# Patient Record
Sex: Female | Born: 1975 | Race: White | Hispanic: Yes | Marital: Married | Smoking: Never smoker
Health system: Southern US, Community
[De-identification: ages and names within clinical notes are randomized; demographics above are authoritative.]

## PROBLEM LIST (undated history)

## (undated) HISTORY — PX: TUBAL LIGATION: SHX77

---

## 2005-05-04 ENCOUNTER — Emergency Department (HOSPITAL_COMMUNITY): Admission: EM | Admit: 2005-05-04 | Discharge: 2005-05-04 | Payer: Self-pay | Admitting: Emergency Medicine
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
## 2015-04-13 ENCOUNTER — Ambulatory Visit: Payer: Self-pay | Admitting: Family

## 2015-04-14 ENCOUNTER — Ambulatory Visit (INDEPENDENT_AMBULATORY_CARE_PROVIDER_SITE_OTHER): Payer: 59 | Admitting: Family

## 2015-04-14 ENCOUNTER — Encounter: Payer: Self-pay | Admitting: Family

## 2015-04-14 VITALS — BP 116/73 | HR 95 | Temp 97.7°F | Ht 64.0 in | Wt 158.8 lb

## 2015-04-14 DIAGNOSIS — Z713 Dietary counseling and surveillance: Secondary | ICD-10-CM

## 2015-04-14 DIAGNOSIS — Z13220 Encounter for screening for lipoid disorders: Secondary | ICD-10-CM | POA: Diagnosis not present

## 2015-04-14 MED ORDER — PHENTERMINE HCL 37.5 MG PO TABS: 37.5000 mg | ORAL_TABLET | Freq: Every day | ORAL | Status: DC

## 2015-04-14 NOTE — Progress Notes (Signed)
   Subjective:    Patient ID: Connie Glover, female    DOB: Jan 02, 1976, 39 y.o.   MRN: [id number]  HPI Pt presents to the office today to establish care and discuss weight loss. Pt states she was seen by another provider and was started on phentermine 37.5 mg in September and states she has lost 10 lbs. Pt reports feeling good since starting the medication and would like to continue it. Pt states she had her TSH checked and it was 1.040.    Review of Systems  Constitutional: Negative.   HENT: Negative.   Eyes: Negative.   Respiratory: Negative.  Negative for shortness of breath.   Cardiovascular: Negative.  Negative for palpitations.  Gastrointestinal: Negative.   Endocrine: Negative.   Genitourinary: Negative.   Musculoskeletal: Negative.   Neurological: Negative.  Negative for headaches.  Hematological: Negative.   Psychiatric/Behavioral: Negative.   All other systems reviewed and are negative.      Objective:   Physical Exam  Constitutional: She is oriented to person, place, and time. She appears well-developed and well-nourished. No distress.  HENT:  Head: Normocephalic and atraumatic.  Right Ear: External ear normal.  Left Ear: External ear normal.  Nose: Nose normal.  Mouth/Throat: Oropharynx is clear and moist.  Eyes: Pupils are equal, round, and reactive to light.  Neck: Normal range of motion. Neck supple. No thyromegaly present.  Cardiovascular: Normal rate, regular rhythm, normal heart sounds and intact distal pulses.   No murmur heard. Pulmonary/Chest: Effort normal and breath sounds normal. No respiratory distress. She has no wheezes.  Abdominal: Soft. Bowel sounds are normal. She exhibits no distension. There is no tenderness.  Musculoskeletal: Normal range of motion. She exhibits no edema or tenderness.  Neurological: She is alert and oriented to person, place, and time. She has normal reflexes. No cranial nerve deficit.  Skin: Skin is warm and dry.    Psychiatric: She has a normal mood and affect. Her behavior is normal. Judgment and thought content normal.  Vitals reviewed.     BP 116/73 mmHg  Pulse 95  Temp(Src) 97.7 F (36.5 C) (Oral)  Ht _0  (1.626 m)  Wt 158 lb 12.8 oz (72.031 kg)  BMI 27.24 kg/m2     Assessment & Plan:  1. Weight loss counseling, encounter for -Exercising and healthy diet discussed - CMP14+EGFR - phentermine (ADIPEX-P) 37.5 MG tablet; Take 1 tablet (37.5 mg total) by mouth daily before breakfast.  Dispense: 30 tablet; Refill: 2  2. Screening for cholesterol level - CMP14+EGFR - Lipid panel   Continue all meds Labs pending Health Maintenance reviewed Diet and exercise encouraged RTO 3 months   Evelina Dun, FNP

## 2015-04-14 NOTE — Patient Instructions (Signed)
Recuento de caloras para bajar de peso (Calorie Counting for Edison InternationalWeight Loss) Las caloras son energa que se obtiene de lo que se come y se bebe. El organismo Botswanausa esta energa para mantenerlo Scientist, water qualityactivo durante el da. La cantidad de caloras que come tiene incidencia Gap Incen el peso. Cuando come ms caloras de las que el cuerpo necesita, este acumula las caloras extra Woodstoncomo grasa. Cuando come Nationwide Mutual Insurancemenos caloras de las que el cuerpo Otisvillenecesita, este quema grasa para obtener la energa que requiere. El recuento de caloras es el registro de la cantidad de caloras que come y Investment banker, operationalbebe cada da. Si se asegura de comer menos caloras de las que el cuerpo necesita, debe bajar de [place]villepeso. Para que el recuento de caloras funcione, tendr que comer la cantidad de caloras adecuadas para usted en un da, para bajar una cantidad de peso saludable por semana. Una cantidad de peso saludable para bajar por semana suele ser Park Layneentre 1 y Enedina Finner2libras (0,5 a 0,9kg). Un nutricionista puede determinar la cantidad de caloras que necesita por da y sugerirle cmo alcanzar su objetivo calrico.  CUL ES MI PLAN? Mi objetivo es comer __________ Garry Heatercaloras por da.  Si como esta cantidad de caloras por da, debo bajar unas __________ Albertine Grateslibras por semana. QU DEBO SABER ACERCA DEL RECUENTO DE CALORAS? A fin de alcanzar su objetivo diario de caloras, tendr que:  Averiguar cuntas caloras hay en cada alimento que le Lobbyistgustara comer. Intente hacerlo antes de comer.  Decida la cantidad que puede comer del alimento.  Anote lo que comi y cuntas caloras tena. Esta tarea se conoce como llevar un registro de comidas. DNDE ENCUENTRO INFORMACIN SOBRE LAS CALORAS? Es posible Veterinary surgeonencontrar la cantidad de caloras que contiene un alimento en la etiqueta de informacin nutricional. Tenga en cuenta que toda la informacin que se incluye en una etiqueta se basa en una porcin especfica del alimento. Si un alimento no tiene una etiqueta de informacin  nutricional, intente buscar las caloras en Internet o pida ayuda al nutricionista. CMO DECIDO CUNTO COMER? Para decidir qu cantidad del alimento puede comer, tendr que tener en cuenta el nmero de caloras en una porcin y el tamao de una porcin. Es posible Scientist, water qualityencontrar estos datos en la etiqueta de informacin nutricional. Si un alimento no tiene una etiqueta de informacin nutricional, busque los Maxatawnydatos en Internet o pida ayuda al nutricionista. Recuerde que las caloras se calculan por porcin. Si opta por comer ms de una porcin de un alimento, tendr D.R. Horton, Incque multiplicar las caloras por porcin por la cantidad de porciones que planea comer. Por ejemplo, la etiqueta de un envase de pan puede decir que el tamao de una porcin es [place]1rodaja, y que una porcin tiene 90caloras. Si come 1rodaja, habr comido 90caloras. Si come 2rodajas, habr comido 180caloras. CMO LLEVO UN REGISTRO DE COMIDAS? Despus de cada comida, registre la siguiente informacin en el registro de comidas:  Lo que comi.  La cantidad que comi.  La cantidad de caloras que tena.  Luego, sume las caloras. Tenga a Scientific laboratory technicianmano el registro de comidas, por ejemplo, en un anotador de bolsillo. Otra alternativa es usar una aplicacin en el telfono mvil o un sitio web. Algunos programas calcularn las caloras y AES Corporationle mostrarn la cantidad de caloras que le [place]ed[place], Delawarecada vez que agregue un alimento al Engineer, maintenance (IT)registro. CULES SON ALGUNOS CONSEJOS PARA EL RECUENTO DE CALORAS?  Use las caloras de los alimentos y las bebidas que lo sacien y no lo dejen con apetito, por ejemplo, frutos secos  y Civil engineer, contractingmantequillas de frutos secos, verduras, protenas magras y alimentos con alto contenido de fibra (ms de 5g de fibra por porcin).  Coma alimentos nutritivos y evite las caloras vacas. Las caloras vacas son aquellas que se obtienen de los alimentos o las bebidas que no contienen muchos nutrientes, como los dulces y los refrescos. Es mejor comer  una comida nutritiva altamente calrica (como un aguacate) que una con pocos nutrientes (como una bolsa de patatas fritas).  Sepa cuntas caloras tienen los alimentos que come con ms frecuencia. Liberty GlobalDe este modo, no tiene que buscar este dato cada vez que los come.  Est atento a los International aid/development workeralimentos que pueden parecer hipocalricos, pero que en realidad contienen muchas caloras, como los productos de Cobdenpanadera, los refrescos y los dulces sin Holiday representativegrasa.  Preste atencin a las Limited Brandscaloras en las bebidas, Lubrizol Corporationcomo los refrescos, las bebidas a base de McBeecaf, las bebidas con alcohol y los jugos, que contienen muchas caloras, pero no le dan saciedad. Opte por las bebidas bajas en caloras, como el agua y las [address]bebidas dietticas.  Concntrese en tratar de contar las caloras de los alimentos que tienen la mayor cantidad de caloras. Registrar las caloras de una ensalada que solo contiene hortalizas es menos importante que calcular las de un batido de Tiog[place]eche.  Encuentre un mtodo para controlar las caloras que funcione para usted. Sea creativo. La Harley-Davidsonmayora de las personas que alcanzan el xito encuentran mtodos para llevar un registro de cunto comen en un da, incluso si no cuentan cada calora. CULES SON ALGUNOS CONSEJOS PARA CONTROLAR LAS PORCIONES?  Sepa cuntas caloras hay en una porcin. Esto lo ayudar a saber cuntas porciones de un alimento determinado puede comer.  Use una taza medidora para medir los Franklin Resourcestamaos de las porciones, lo que es muy til al principio. Con el tiempo, podr hacer un clculo estimativo de los tamaos de las porciones de algunos alimentos.  Dedique tiempo a poner porciones de diferentes alimentos en sus platos, tazones y tazas predilectos, a fin de saber cmo se ve una porcin.  Intente no comer directamente de Burkina Fasouna bolsa o una caja, ya que esto puede llevarlo a comer en exceso. Ponga la cantidad Wal-Martque le gustara comer en una taza o un plato, a fin de asegurarse de que est comiendo la  porcin correcta.  Use platos, vasos y tazones ms pequeos para no comer en exceso. Esta es una forma rpida y sencilla de poner en prctica el control de las porciones. Si el plato es ms pequeo, le caben menos alimentos.  Intente no hacer muchas tareas mientras come, como ver televisin o usar la computadora. Si es la hora de comer, sintese a Museum/gallery conservatorla mesa y disfrute de Chemical engineerla comida. Esto lo ayudar a que empiece a Public house managerreconocer cundo est satisfecho. Tambin le permitir estar ms consciente de lo que come y cunto est comiendo. CMO PUEDO HACER EL RECUENTO DE CALORAS CUANDO COMO AFUERA?  Pida porciones ms pequeas o porciones para nios.  Considere la posibilidad de Agricultural consultantcompartir un plato principal y las guarniciones, en lugar de pedir su propio plato principal.  Si pide su propio plato principal, coma solo la mitad. Pida una caja al comienzo de la comida y ponga all el resto del plato principal, para no sentir la tentacin de comerlo.  Busque las caloras en el men. Si se detallan las caloras, elija las opciones que contengan la menor cantidad.  Elija platos que incluyan verduras, frutas, cereales integrales, productos lcteos con bajo contenido de [place] deg [place]grasa  y protenas magras. Centrarse en elegir con inteligencia alimentos de cada uno de los 5grupos que puede ayudarlo a seguir por el buen camino en los restaurantes.  Opte por los alimentos hervidos, asados, cocidos a la parrilla o al vapor.  Elija el agua, la [place], [place] t helado sin azcar u otras bebidas que no contengan azcares agregados. Si desea una bebida alcohlica, escoja una opcin con menos caloras. Por ejemplo, un margarita normal puede [employer], y un vaso de vino tiene unas 150.  No coma alimentos que contengan mantequilla, estn empanados, fritos o que se sirvan con salsa a base de crema. Generalmente, los alimentos que se etiquetan como "crujientes" estn fritos, a menos que se indique lo contrario.  Ordene los  Pathmark Stores, las salsas y los jarabes aparte, ya que suelen tener muchas caloras; por lo tanto, no los consuma en grandes cantidades.  Tenga cuidado con las Magna. Muchas personas piensan que las ensaladas son Neomia Dear opcin saludable, pero en muchas cosas, esto no es as. Hay muchas ensaladas que contienen tocino, pollo frito, grandes cantidades de [place], patatas fritas y Print production planner. Todos estos productos son altamente calricos. Si desea Canary Brim, elija una de hortalizas y pida carnes a la parrilla o un filete. Ordene el aderezo aparte o pida aceite de [place] y vinagre o limn para Emergency planning/management officer.  Haga un clculo estimativo de la cantidad de porciones que le sirven. Por ejemplo, una porcin de arroz cocido equivale a media taza o tiene el tamao aproximado de un molde de [place], o de media pelota de tenis. Conocer el tamao de las porciones lo ayudar a Theme park manager atento a la cantidad de comida que come Pitney Bowes. La lista que sigue le [place] el tamao de algunas porciones comunes a partir de objetos cotidianos.  1onza (28g) = 4dados apilados.  3onzas (85g) = de cartas.  1cucharadita = 1dado.  1cucharada = media pelota de tenis de mesa.  2cucharadas = 1pelota de tenis de mesa.  Media taza = 1pelota de tenis o de magdalena.  1taza = 1 pelota de bisbol. Document Released: 10/13/2008 Document Revised: 11/11/2013 [facility] Patient Information 2015 [place], [place]. This information is not intended to replace advice given to you by your health care provider. Make sure you discuss any questions you have with your health care provider. Ejercicios para perder peso (Exercise to Lose Weight) La actividad fsica y Neomia Dear dieta saludable ayudan a perder peso. El mdico podr sugerirle ejercicios especficos. IDEAS Y CONSEJOS PARA HACER EJERCICIOS  Elija opciones econmicas que disfrute hacer , como caminar, andar en bicicleta o los vdeos para ejercitarse.  Utilice las  Microbiologist del ascensor.  Camine durante la hora del almuerzo.  Estacione el auto lejos del lugar de Muse o [place].  Concurra a un gimnasio o tome clases de gimnasia.  Comience con 5  10 minutos de actividad fsica por da. Ejercite hasta 30 minutos, 4 a 6 das por [address]  Utilice zapatos que tengan un buen soporte y ropas cmodas.  Elongue antes y despus de Company secretary.  Ejercite hasta que aumente la respiracin y el corazn palpite rpido.  Beba agua extra cuando ejercite.  No haga ejercicio Firefighter, sentirse mareado o que le falte mucho el aire. La actividad fsica puede quemar alrededor de 150 caloras.  Correr 20 cuadras en 15 minutos.  Jugar vley durante 45 a 60 minutos.  Limpiar y encerar el auto durante 45 a 60 minutos.  Jugar ftbol americano de toque.  Caminar 25 cuadras en 35 minutos.  Empujar un cochecito 20 cuadras en 30 minutos.  Jugar baloncesto durante 30 minutos.  Rastrillar hojas secas durante 30 minutos.  Andar en bicicleta 80 cuadras en 30 minutos.  Caminar 30 cuadras en 30 minutos.  Bailar durante 30 minutos.  Quitar la nieve con una pala durante 15 minutos.  Nadar vigorosamente durante 20 minutos.  Subir escaleras durante 15 minutos.  Andar en bicicleta 60 cuadras durante 15 minutos.  Arreglar el jardn entre 30 y 45 minutos.  Saltar a la soga durante 15 minutos.  Limpiar vidrios o pisos durante 45 a 60 minutos. Document Released: 10/01/2010 Document Revised: 09/19/2011 [facility] Patient Information 2015 [place], [place]. This information is not intended to replace advice given to you by your health care provider. Make sure you discuss any questions you have with your health care provider.

## 2015-04-15 LAB — CMP14+EGFR
ALT: 12 IU/L (ref 0–32)
AST: 17 IU/L (ref 0–40)
Albumin/Globulin Ratio: 1.4 (ref 1.1–2.5)
Albumin: 4.1 g/dL (ref 3.5–5.5)
Alkaline Phosphatase: 90 IU/L (ref 39–117)
BUN/Creatinine Ratio: 16 (ref 8–20)
BUN: 11 mg/dL (ref 6–20)
Bilirubin Total: 0.3 mg/dL (ref 0.0–1.2)
CO2: 23 mmol/L (ref 18–29)
Calcium: 8.9 mg/dL (ref 8.7–10.2)
Chloride: 101 mmol/L (ref 97–108)
Creatinine, Ser: 0.69 mg/dL (ref 0.57–1.00)
GFR calc Af Amer: 129 mL/min/{1.73_m2} (ref >59)
GFR calc non Af Amer: 112 mL/min/{1.73_m2} (ref >59)
Globulin, Total: 2.9 g/dL (ref 1.5–4.5)
Glucose: 95 mg/dL (ref 65–99)
Potassium: 4.6 mmol/L (ref 3.5–5.2)
Sodium: 138 mmol/L (ref 134–144)
Total Protein: 7 g/dL (ref 6.0–8.5)

## 2015-04-15 LAB — LIPID PANEL
Chol/HDL Ratio: 3.5 ratio units (ref 0.0–4.4)
Cholesterol, Total: 154 mg/dL (ref 100–199)
HDL: 44 mg/dL (ref >39)
LDL Calculated: 92 mg/dL (ref 0–99)
Triglycerides: 92 mg/dL (ref 0–149)
VLDL Cholesterol Cal: 18 mg/dL (ref 5–40)

## 2015-04-16 ENCOUNTER — Encounter: Payer: Self-pay | Admitting: *Deleted

## 2015-07-20 ENCOUNTER — Ambulatory Visit: Payer: 59 | Admitting: Family

## 2016-05-06 ENCOUNTER — Ambulatory Visit (INDEPENDENT_AMBULATORY_CARE_PROVIDER_SITE_OTHER): Payer: BLUE CROSS/BLUE SHIELD | Admitting: Pediatrics

## 2016-05-06 ENCOUNTER — Encounter: Payer: Self-pay | Admitting: Pediatrics

## 2016-05-06 DIAGNOSIS — Z713 Dietary counseling and surveillance: Secondary | ICD-10-CM | POA: Diagnosis not present

## 2016-05-06 MED ORDER — PHENTERMINE HCL 37.5 MG PO TABS: 37.5000 mg | ORAL_TABLET | Freq: Every day | ORAL | 1 refills | Status: DC

## 2016-05-06 NOTE — Progress Notes (Signed)
  Subjective:   Patient ID: Connie Glover, female    DOB: 03/19/1976, 40 y.o.   MRN: [id number] CC: Medication Refill (Phentermine)  HPI: Connie DessertMaria Dalgleish is a 40 y.o. female presenting for Medication Refill (Phentermine)  Has been trying to lose weight Lost weight last weight with it Eating three meals every day 2 days a week zumba 40yo girl Mia, 15yo and 8yo son Has been on phentermine in the past and it has helped Wants to try it again  Most bothered by her weight and stomach fat Gained weight since delivery 3 yrs ago  Relevant past medical, surgical, family and social history reviewed. Allergies and medications reviewed and updated. History  Smoking Status  . Never Smoker  Smokeless Tobacco  . Never Used   ROS: Per HPI   Objective:    BP 139/71   Pulse 71   Temp 98 F (36.7 C) (Oral)   Ht 5\' 4"  (1.626 m)   Wt 165 lb 6.4 oz (75 kg)   BMI 28.39 kg/m   Wt Readings from Last 3 Encounters:  05/06/16 165 lb 6.4 oz (75 kg)  04/14/15 158 lb 12.8 oz (72 kg)    Gen: NAD, alert, cooperative with exam, NCAT EYES: EOMI, no conjunctival injection, or no icterus CV: NRRR, normal S1/S2, no murmur, distal pulses 2+ b/l Resp: CTABL, no wheezes, normal WOB Abd: +BS, soft, NTND. no guarding or organomegaly Ext: No edema, warm Neuro: Alert and oriented, strength equal b/l UE and LE, coordination grossly normal MSK: normal muscle bulk  Assessment & Plan:  Byrd HesselbachMaria was seen today for medication refill, weight loss counseling  Diagnoses and all orders for this visit:  Weight loss counseling, encounter for Discussed weight loss strategies Continue minimal sugar in diet No snacks Increase water intake 30 min exercise 5 days a week Pt wants to try phentermine again as it worked for her before Will try 2 months of below, Rx given, must follow up for refills -     phentermine (ADIPEX-P) 37.5 MG tablet; Take 1 tablet (37.5 mg total) by mouth daily before breakfast.   Follow up  plan: Return in about 2 months (around 07/06/2016). Rex Krasarol Leilan Bochenek, MD [facility]

## 2016-05-06 NOTE — Patient Instructions (Signed)
Eat three meals a day Avoid getting second helpings at a meal No snacks between meals Drink plenty of water Avoid candy, sugary beverages Try to get 20-30 minutes of exercise in (walking, zumba) 5 days a week

## 2016-07-15 ENCOUNTER — Ambulatory Visit: Payer: BLUE CROSS/BLUE SHIELD | Admitting: Pediatrics

## 2016-07-18 ENCOUNTER — Encounter: Payer: Self-pay | Admitting: Pediatrics

## 2016-07-18 ENCOUNTER — Telehealth: Payer: Self-pay | Admitting: Pediatrics

## 2017-09-04 ENCOUNTER — Ambulatory Visit: Payer: Self-pay | Admitting: Pediatrics

## 2017-09-04 ENCOUNTER — Encounter: Payer: Self-pay | Admitting: Pediatrics

## 2017-09-04 VITALS — BP 133/83 | HR 85 | Temp 97.7°F | Ht 64.0 in | Wt 176.0 lb

## 2017-09-04 DIAGNOSIS — E039 Hypothyroidism, unspecified: Secondary | ICD-10-CM

## 2017-09-04 NOTE — Progress Notes (Signed)
  Subjective:   Patient ID: Connie Glover, female    DOB: 07/09/1976, 42 y.o.   MRN: [id number] CC: Weight Check and Thyroid Problem  HPI: Connie Glover is a 42 y.o. female presenting for Weight Check and Thyroid Problem  Sleeping too much, weight gain, nails easily, losing hair. [place]. First test over a year ago, was told it was low, not started on medication. Having normal periods. Has had tube tie. Lost weight on the phentermine down to 142 lbs, then gained weight. Feels cold all the time.   Relevant past medical, surgical, family and social history reviewed. Allergies and medications reviewed and updated. Social History   Tobacco Use  Smoking Status Never Smoker  Smokeless Tobacco Never Used   ROS: Per HPI   Objective:    BP 133/83   Pulse 85   Temp 97.7 F (36.5 C) (Oral)   Ht 5\' 4"  (1.626 m)   Wt 176 lb (79.8 kg)   BMI 30.21 kg/m   Wt Readings from Last 3 Encounters:  09/04/17 176 lb (79.8 kg)  05/06/16 165 lb 6.4 oz (75 kg)  04/14/15 158 lb 12.8 oz (72 kg)    Gen: NAD, alert, cooperative with exam, NCAT EYES: EOMI, no conjunctival injection, or no icterus ENT:   OP without erythema LYMPH: no cervical LAD NECK: no thyroid enlargement CV: NRRR, normal S1/S2, no murmur, distal pulses 2+ b/l Resp: CTABL, no wheezes, normal WOB Ext: No edema, warm Neuro: Alert and oriented MSK: normal muscle bulk  Assessment & Plan:  Connie Glover was seen today for weight check and thyroid problem.  Diagnoses and all orders for this visit:  Hypothyroidism, unspecified type -     TSH   Follow up plan: 8 weeks Rex Krasarol Frederick Marro, MD [facility]

## 2017-09-05 LAB — TSH: TSH: 1.13 u[IU]/mL (ref 0.450–4.500)

## 2017-10-30 ENCOUNTER — Ambulatory Visit: Payer: Medicaid Other | Admitting: Pediatrics

## 2017-11-06 ENCOUNTER — Encounter: Payer: Self-pay | Admitting: Pediatrics

## 2017-11-17 ENCOUNTER — Telehealth: Payer: Self-pay | Admitting: Pediatrics

## 2017-11-17 ENCOUNTER — Ambulatory Visit: Payer: Medicaid Other | Admitting: Family Medicine

## 2017-11-17 NOTE — Telephone Encounter (Signed)
Aware of results. 

## 2018-11-14 ENCOUNTER — Telehealth: Payer: Self-pay | Admitting: Family
